# Patient Record
Sex: Female | Born: 1995 | Race: White | Hispanic: No | Marital: Single | Smoking: Never smoker
Health system: Southern US, Community
[De-identification: ages and names within clinical notes are randomized; demographics above are authoritative.]

## PROBLEM LIST (undated history)

## (undated) HISTORY — PX: INTRAUTERINE DEVICE (IUD) INSERTION: SHX5877

---
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
## 2016-10-23 ENCOUNTER — Emergency Department
Admission: EM | Admit: 2016-10-23 | Discharge: 2016-10-23 | Disposition: A | Discharge status: Home / Self Care | Payer: BLUE CROSS/BLUE SHIELD | Attending: Emergency Medicine | Admitting: Emergency Medicine

## 2016-10-23 DIAGNOSIS — R1030 Lower abdominal pain, unspecified: Secondary | ICD-10-CM | POA: Diagnosis not present

## 2016-10-23 DIAGNOSIS — N939 Abnormal uterine and vaginal bleeding, unspecified: Secondary | ICD-10-CM | POA: Diagnosis present

## 2016-10-23 DIAGNOSIS — N930 Postcoital and contact bleeding: Secondary | ICD-10-CM | POA: Diagnosis not present

## 2016-10-23 LAB — CHLAMYDIA/NGC RT PCR (ARMC ONLY)
Chlamydia Tr: NOT DETECTED
N gonorrhoeae: NOT DETECTED

## 2016-10-23 LAB — WET PREP, GENITAL
CLUE CELLS WET PREP: NONE SEEN
SPERM: NONE SEEN
TRICH WET PREP: NONE SEEN
YEAST WET PREP: NONE SEEN

## 2016-10-23 LAB — URINALYSIS, COMPLETE (UACMP) WITH MICROSCOPIC
Bacteria, UA: NONE SEEN
Bilirubin Urine: NEGATIVE
Glucose, UA: NEGATIVE mg/dL
Ketones, ur: NEGATIVE mg/dL
Leukocytes, UA: NEGATIVE
Nitrite: NEGATIVE
Protein, ur: NEGATIVE mg/dL
Specific Gravity, Urine: 1.014 (ref 1.005–1.030)
pH: 5 (ref 5.0–8.0)

## 2016-10-23 LAB — POCT PREGNANCY, URINE: PREG TEST UR: NEGATIVE

## 2016-10-23 NOTE — Discharge Instructions (Signed)
Call tomorrow to make an appointment with Dr. Jean RosenthalJackson.  Right them know that you were seen in the emergency room and referred to that office.

## 2016-10-23 NOTE — ED Notes (Addendum)
See triage note  Having lower abd cramping after having  An organism  Had some bleeding at that time which was heavy  Now states bleeding has slowed down   Thinks this is b/c of her IUD  Has had similar episode in past

## 2016-10-23 NOTE — ED Triage Notes (Addendum)
Pt reports that immediately after orgasm without penetration that she began having severe bleeding that lasted for approx 30 minutes - she reports that clots were present - she is now having lower abd cramping - pt feels like she had a problem with her IUD because this has happened to her before

## 2016-10-23 NOTE — ED Provider Notes (Signed)
[facility] Emergency Department Provider Note  ____________________________________________   First MD Initiated Contact with Patient 10/23/16 1837     (approximate)  I have reviewed the triage vital signs and the nursing notes.   HISTORY  Chief Complaint Abdominal Pain    HPI Kristen Austin is a 21 y.o. female is here with complaint of vaginal bleeding approximately lasting 30 minutes after having intercourse. Patient states that there were clots present and had some low abdominal cramping. She states that she had similar symptoms once before. She is states she has an [facility] [place]. She does not have a local gynecologist.She denies any vaginal discharge, fever, chills, nausea or vomiting. Currently she rates her discomfort as a 4 out of 10.   History reviewed. No pertinent past medical history.  There are no active problems to display for this patient.   History reviewed. No pertinent surgical history.  Prior to Admission medications   Not on File    Allergies Patient has no allergy information on record.  No family history on file.  Social History Social History  Substance Use Topics  . Smoking status: Never Smoker  . Smokeless tobacco: Never Used  . Alcohol use Yes     Comment: occ    Review of Systems  Constitutional: No fever/chills Cardiovascular: Denies chest pain. Respiratory: Denies shortness of breath. Gastrointestinal: No abdominal pain.  No nausea, no vomiting.  Genitourinary: Positive for vaginal bleeding. Musculoskeletal: Negative for back pain. Skin: Negative for rash. Neurological: Negative for headaches   ____________________________________________   PHYSICAL EXAM:  VITAL SIGNS: ED Triage Vitals  Enc Vitals Group     BP 10/23/16 1810 124/69     Pulse Rate 10/23/16 1810 68     Resp 10/23/16 1810 15     Temp 10/23/16 1810 98.7 F (37.1 C)     Temp Source  10/23/16 1810 Oral     SpO2 10/23/16 1810 98 %     Weight 10/23/16 1811 135 lb (61.2 kg)     Height 10/23/16 1811 5\' 6"  (1.676 m)     Head Circumference --      Peak Flow --      Pain Score 10/23/16 1810 4     Pain Loc --      Pain Edu? --      Excl. in GC? --     Constitutional: Alert and oriented. Well appearing and in no acute distress. Eyes: Conjunctivae are normal. PERRL. EOMI. Head: Atraumatic. Neck: No stridor.   Cardiovascular: Normal rate, regular rhythm. Grossly normal heart sounds.  Good peripheral circulation. Respiratory: Normal respiratory effort.  No retractions. Lungs CTAB. Gastrointestinal: Soft and nontender. No distention.  Genitourinary:  Musculoskeletal: No lower extremity tenderness nor edema.  No joint effusions. Neurologic:  Normal speech and language. No gross focal neurologic deficits are appreciated. No gait instability. Skin:  Skin is warm, dry and intact. No rash noted. Psychiatric: Mood and affect are normal. Speech and behavior are normal.  ____________________________________________   LABS (all labs ordered are listed, but only abnormal results are displayed)  Labs Reviewed  WET PREP, GENITAL - Abnormal; Notable for the following:       Result Value   WBC, Wet Prep HPF POC RARE (*)    All other components within normal limits  URINALYSIS, COMPLETE (UACMP) WITH MICROSCOPIC - Abnormal; Notable for the following:    Color, Urine YELLOW (*)  APPearance CLEAR (*)    Hgb urine dipstick MODERATE (*)    Squamous Epithelial / LPF 0-5 (*)    All other components within normal limits  CHLAMYDIA/NGC RT PCR ([facility] ONLY)  POC URINE PREG, ED  POCT PREGNANCY, URINE   _ PROCEDURES  Procedure(s) performed: None  Procedures  Critical Care performed: No  ____________________________________________   INITIAL IMPRESSION / ASSESSMENT AND PLAN / ED COURSE  Pertinent labs & imaging results that were available during my care of the patient  were reviewed by me and considered in my medical decision making (see chart for details).  Patient was made aware that she would need to have further testing. She is to follow-up with Dr. Jean RosenthalJackson who is the GYN on call tonight. Wet prep was negative. At the time of discharge chlamydia and gonorrhea test was pending.    ___________________________________________   FINAL CLINICAL IMPRESSION(S) / ED DIAGNOSES  Final diagnoses:  Abnormal vaginal bleeding      NEW MEDICATIONS STARTED DURING THIS VISIT:  There are no discharge medications for this patient.    Note:  This document was prepared using Dragon voice recognition software and may include unintentional dictation errors.    Tommi RumpsSummers, Ezekiel Menzer L, PA-C 10/24/16 1351    Don PerkingVeronese, [place], MD 10/26/16 [phone]

## 2016-11-19 ENCOUNTER — Ambulatory Visit (INDEPENDENT_AMBULATORY_CARE_PROVIDER_SITE_OTHER): Payer: BLUE CROSS/BLUE SHIELD | Admitting: Obstetrics and Gynecology

## 2016-11-19 ENCOUNTER — Encounter: Payer: Self-pay | Admitting: Obstetrics and Gynecology

## 2016-11-19 VITALS — BP 118/74 | Ht 66.0 in | Wt 136.0 lb

## 2016-11-19 DIAGNOSIS — N920 Excessive and frequent menstruation with regular cycle: Secondary | ICD-10-CM | POA: Diagnosis not present

## 2016-11-19 NOTE — Progress Notes (Signed)
Obstetrics & Gynecology Office Visit    Chief Complaint  Patient presents with  . Follow-up    ER Follow up  heavy bleeding around the time of intercourse.  History of Present Illness:  21 y.o. G0P0000 female who is seen in follow up after an ER visit on 6/12.  She had a [place] IUD placed in November 2017. She had no bleeding since that time until the event that sent her to the ER. She had her onset of bleeding in June and presented to ER the same day.  The bleeding occurred in relation to intercourse. She states that she was having non-penetration intercourse, had and orgasm, and began bleeding heavily.  Had bleeding for a week or 2 after and has had none since. Workup in ER negative. No ultrasound performed. Plan to get ultrasound, persistent as possible. Her ER workup included a negative pregnancy test, negative testing for gonorrhea and chlamydia, negative wet prep, and negative urinalysis.    Past Medical History: denies  Past Surgical History:  Procedure Laterality Date  . INTRAUTERINE DEVICE (IUD) INSERTION      Gynecologic History: No LMP recorded. Patient is not currently having periods (Reason: IUD).  Obstetric History: G0P0000  Family History  Problem Relation Age of Onset  . Breast cancer Maternal Aunt     Social History   Social History  . Marital status: Single    Spouse name: N/A  . Number of children: N/A  . Years of education: N/A   Occupational History  . Not on file.   Social History Main Topics  . Smoking status: Never Smoker  . Smokeless tobacco: Never Used  . Alcohol use Yes     Comment: occ  . Drug use: No  . Sexual activity: Yes    Birth control/ protection: IUD   Other Topics Concern  . Not on file   Social History Narrative  . No narrative on file   Allergies: No Known Allergies    Medication Sig Start Date End Date Taking? Authorizing Provider  Levonorgestrel (KYLEENA) 19.5 MG IUD by Intrauterine route.   Yes [provider]  dexmethylphenidate (FOCALIN) 10 MG tablet Take by mouth.    [provider]   Review of Systems  Constitutional: Negative.   HENT: Negative.   Eyes: Negative.   Respiratory: Negative.   Cardiovascular: Negative.   Gastrointestinal: Negative.   Genitourinary: Negative.   Musculoskeletal: Negative.   Skin: Negative.   Neurological: Negative.   Psychiatric/Behavioral: Negative.    Physical Exam BP 118/74   Ht 5\' 6"  (1.676 m)   Wt 136 lb (61.7 kg)   BMI 21.95 kg/m  No LMP recorded. Patient is not currently having periods (Reason: IUD). Physical Exam  Constitutional: She is oriented to person, place, and time. She appears well-developed and well-nourished. No distress.  Genitourinary: Vagina normal and uterus normal. Pelvic exam was performed with patient supine. There is no rash, tenderness, lesion or injury on the right labia. There is no rash, tenderness, lesion or injury on the left labia. Vagina exhibits no lesion. No tenderness or bleeding in the vagina. No foreign body in the vagina. No signs of injury around the vagina. Right adnexum does not display mass, does not display tenderness and does not display fullness. Left adnexum does not display mass, does not display tenderness and does not display fullness.  Cervix is not nulliparous.  Cervix exhibits pinkness and visible IUD strings. Cervix does not exhibit motion tenderness, discharge, friability or  polyp.   Uterus is mobile. Uterus is not enlarged, tender or exhibiting a mass.  HENT:  Head: Normocephalic and atraumatic.  Eyes: EOM are normal. No scleral icterus.  Neck: Normal range of motion. Neck supple. No thyromegaly present.  Cardiovascular: Normal rate and regular rhythm.  Exam reveals no gallop and no friction rub.   No murmur heard. Pulmonary/Chest: Effort normal and breath sounds normal. No respiratory distress. She has no wheezes. She has no rales.  Abdominal: Soft. Bowel sounds are normal.  She exhibits no distension and no mass. There is no tenderness. There is no rebound and no guarding.  Musculoskeletal: Normal range of motion. She exhibits no edema.  Lymphadenopathy:    She has no cervical adenopathy.  Neurological: She is alert and oriented to person, place, and time. No cranial nerve deficit.  Skin: Skin is warm and dry. No erythema.  Psychiatric: She has a normal mood and affect. Her behavior is normal. Judgment normal.   Female chaperone present for pelvic and breast  portions of the physical exam  Assessment: 21 y.o. G0P0000 female Menorrhagia with regular cycle Reason uncertain with negative work up so far.  Will obtain pelvic ultrasound to assess location of IUD and other potential lesions.   Plan: Problem List Items Addressed This Visit    Menorrhagia with regular cycle - Primary    Reason uncertain with negative work up so far.  Will obtain pelvic ultrasound to assess location of IUD and other potential lesions.      Relevant Orders   US Transvaginal Non-OB      Thomasene MohairStephen Jackson, MD 11/21/2016 10:38 AM

## 2016-11-20 ENCOUNTER — Ambulatory Visit (INDEPENDENT_AMBULATORY_CARE_PROVIDER_SITE_OTHER): Payer: BLUE CROSS/BLUE SHIELD

## 2016-11-20 ENCOUNTER — Ambulatory Visit (INDEPENDENT_AMBULATORY_CARE_PROVIDER_SITE_OTHER): Payer: BLUE CROSS/BLUE SHIELD | Admitting: Obstetrics and Gynecology

## 2016-11-20 VITALS — BP 118/70 | Ht 66.0 in

## 2016-11-20 DIAGNOSIS — N920 Excessive and frequent menstruation with regular cycle: Secondary | ICD-10-CM | POA: Diagnosis not present

## 2016-11-20 NOTE — Progress Notes (Signed)
   Gynecology Ultrasound Follow Up   Chief Complaint  Patient presents with  . Follow-up    U/S Follow Up   History of Present Illness: Patient is a 21 y.o. female who presents today for ultrasound evaluation of abnormal bleeding .  Ultrasound demonstrates the following findings Adnexa: no masses seen  Uterus: anteverted with endometrial stripe  4.2 mm Additional: IUD visualized in the correct location in the uterus  Review of Systems: Review of Systems  Constitutional: Negative.   HENT: Negative.   Eyes: Negative.   Respiratory: Negative.   Cardiovascular: Negative.   Gastrointestinal: Negative.   Genitourinary: Negative.   Musculoskeletal: Negative.   Skin: Negative.   Neurological: Negative.   Psychiatric/Behavioral: Negative.     Past Medical History: Denies  Past Surgical History:  Procedure Laterality Date  . INTRAUTERINE DEVICE (IUD) INSERTION     Family History  Problem Relation Age of Onset  . Breast cancer Maternal Aunt     Social History   Social History  . Marital status: Single    Spouse name: N/A  . Number of children: N/A  . Years of education: N/A   Occupational History  . Not on file.   Social History Main Topics  . Smoking status: Never Smoker  . Smokeless tobacco: Never Used  . Alcohol use Yes     Comment: occ  . Drug use: No  . Sexual activity: Yes    Birth control/ protection: IUD   Other Topics Concern  . Not on file   Social History Narrative  . No narrative on file    Allergies: No Known Allergies  Medications: Prior to Admission medications   Medication Sig Start Date End Date Taking? Authorizing Provider  dexmethylphenidate (FOCALIN) 10 MG tablet Take by mouth.    [provider]  Levonorgestrel (KYLEENA) 19.5 MG IUD by Intrauterine route.    [provider]    Physical Exam Vitals: Blood pressure 118/70, height 5\' 6"  (1.676 m).  General: NAD HEENT: normocephalic, anicteric Pulmonary: No  increased work of breathing Extremities: no edema, erythema, or tenderness Neurologic: Grossly intact, normal gait Psychiatric: mood appropriate, affect full   Assessment: 21 y.o. G0P0000 No problem-specific Assessment & Plan notes found for this encounter.   Plan: Problem List Items Addressed This Visit    None      1)

## 2016-11-21 DIAGNOSIS — N920 Excessive and frequent menstruation with regular cycle: Secondary | ICD-10-CM | POA: Insufficient documentation

## 2016-11-21 NOTE — Assessment & Plan Note (Signed)
Patient reassured today. No pertinent findings on ultrasound.  IUD in correct location and workup completely negative.  She states that she is satisfied with the explanation and wonders if it will happen again. I discussed that since I can find no problems, I can not state whether it will occur again. Discussed that she may need stabilization of her uterine lining and that if this becomes necessary she could be cycled with some form of estrogen, either estradiol or combined OCPs for a few months.

## 2016-11-21 NOTE — Assessment & Plan Note (Signed)
Reason uncertain with negative work up so far.  Will obtain pelvic ultrasound to assess location of IUD and other potential lesions.
